# Patient Record
Sex: Female | Born: 1970 | Race: White | Hispanic: No | Marital: Married
Health system: Southern US, Community
[De-identification: ages and names within clinical notes are randomized; demographics above are authoritative.]

---

## 2004-11-20 ENCOUNTER — Encounter: Admission: RE | Admit: 2004-11-20 | Discharge: 2004-11-20 | Payer: Self-pay | Admitting: Obstetrics and Gynecology

## 2004-11-23 ENCOUNTER — Ambulatory Visit (HOSPITAL_COMMUNITY): Admission: RE | Admit: 2004-11-23 | Discharge: 2004-11-23 | Payer: Self-pay | Admitting: Surgery

## 2004-12-01 ENCOUNTER — Ambulatory Visit (HOSPITAL_COMMUNITY): Admission: RE | Admit: 2004-12-01 | Discharge: 2004-12-01 | Payer: Self-pay | Admitting: Surgery

## 2004-12-09 ENCOUNTER — Encounter: Admission: RE | Admit: 2004-12-09 | Discharge: 2005-03-09 | Payer: Self-pay | Admitting: Surgery

## 2013-12-26 ENCOUNTER — Encounter (HOSPITAL_COMMUNITY): Payer: Self-pay | Admitting: Obstetrics and Gynecology

## 2013-12-26 ENCOUNTER — Other Ambulatory Visit (HOSPITAL_COMMUNITY): Payer: Self-pay | Admitting: Obstetrics and Gynecology

## 2013-12-26 DIAGNOSIS — O09522 Supervision of elderly multigravida, second trimester: Secondary | ICD-10-CM

## 2013-12-26 DIAGNOSIS — Z3689 Encounter for other specified antenatal screening: Secondary | ICD-10-CM

## 2014-01-01 ENCOUNTER — Other Ambulatory Visit: Payer: Self-pay

## 2014-01-09 ENCOUNTER — Encounter (HOSPITAL_COMMUNITY): Payer: Self-pay

## 2014-01-09 ENCOUNTER — Other Ambulatory Visit (HOSPITAL_COMMUNITY): Payer: Self-pay

## 2014-01-29 ENCOUNTER — Ambulatory Visit (HOSPITAL_COMMUNITY)
Admission: RE | Admit: 2014-01-29 | Discharge: 2014-01-29 | Disposition: A | Discharge status: Home / Self Care | Payer: BC Managed Care – PPO | Source: Ambulatory Visit | Attending: Obstetrics and Gynecology | Admitting: Obstetrics and Gynecology

## 2014-01-29 ENCOUNTER — Other Ambulatory Visit (HOSPITAL_COMMUNITY): Payer: Self-pay | Admitting: Obstetrics and Gynecology

## 2014-01-29 DIAGNOSIS — O280 Abnormal hematological finding on antenatal screening of mother: Secondary | ICD-10-CM

## 2014-01-29 DIAGNOSIS — O3512X0 Maternal care for (suspected) chromosomal abnormality in fetus, trisomy 18, not applicable or unspecified: Secondary | ICD-10-CM | POA: Insufficient documentation

## 2014-01-29 DIAGNOSIS — O351XX0 Maternal care for (suspected) chromosomal abnormality in fetus, not applicable or unspecified: Secondary | ICD-10-CM | POA: Insufficient documentation

## 2014-01-29 DIAGNOSIS — O09529 Supervision of elderly multigravida, unspecified trimester: Secondary | ICD-10-CM | POA: Insufficient documentation

## 2014-01-29 NOTE — ED Notes (Signed)
Appointment Date:  01/29/2014 DOB: Sep 17, 1971 Referring Provider: Dahlia Bailiff, MD Attending: Derinda Sis, MD  Jessica Webb and her husband, Jessica Webb, were seen for genetic counseling because of a maternal age of 43 y.o.Marland Kitchen     They were counseled regarding maternal age and the association with risk for chromosome conditions due to nondisjunction.   We reviewed chromosomes, nondisjunction, and the associated 1 in 78 risk for fetal aneuploidy related to a maternal age of 43 y.o. in the first trimester.  We specifically discussed Down syndrome (trisomy 58), trisomies 81 and 48, and sex chromosome aneuploidies (47,XXX and 47,XXY) including the common features and prognoses of each.   We reviewed the results of her cell free DNA screening.  This was performed at 10 weeks and was consistent with Trisomy 18.  We discussed that cell free DNA testing (non invasive prenatal screening - NIPS) analyzes cell free DNA from the placenta, which is found in the maternal circulation. This test is not diagnostic for chromosome conditions, but can provide information regarding the presence or absence of extra DNA for chromosomes 13, 18 and 21. Thus, it would not identify or rule out all fetal aneuploidy. The reported detection rate is greater than 99% for Trisomy 21 and 13.  It is greater than 97% for Trisomy 18. The false positive rate is reported to be less than 0.1% for any of these conditions.   We reviewed that the cell free DNA test can not distinguish between aneuploidy confined to the placenta, trisomy 18, partial trisomy 18, or mosaic trisomy 63.  However, an amniocentesis result would be considered diagnostic. We discussed the option of amniocentesis as way to confirm the diagnosis.  It would be examining fetal cells, not placental DNA, and by evaluating intact fetal cells, it could rule out a translocation.  They were counseled about the 1 in 300-500 risk for a pregnancy complication including  miscarriage due to amniocentesis.  Given that she is only 14 weeks today, amniocentesis was not an option at this time.  We discussed that up to 90% of fetuses with Trisomy 18 will have detectable anomalies or soft markers when the fetal anatomy is well visualized on a second trimester anatomy ultrasound.  She understands that she is too early in the pregnancy to identify any anomalies associated with Trisomy 18, by ultrasound.  While a limited ultrasound was performed today, she will return at a later date for a detailed anatomy ultrasound.  Today's ultrasound report will be sent under separate cover.  She understands that not seeing any anomalies at this gestation does not lessen the suspicion for Trisomy 73.  If there are still not anomalies or soft markers later in the 2nd trimester, amniocentesis could provide clarification.  Considering the very high suspicion of trisomy 41, we discussed this diagnosis in detail.  They were counseled that trisomy 50 represents the second most common autosomal trisomy after Down syndrome and occurs in 1 in 3600-8500 livebirths. The prevalence is estimated to be much higher when pregnancy terminations, stillbirths, and miscarriages are included.  We discussed that trisomy 38 results from meiotic nondisjunction involving the 18th pair of chromosomes in the majority of cases. Partial trisomy 18 occurs from an unbalanced translocation and represents <2% of cases; and mosaic trisomy 18 occurs in approximately 4-5% of cases.   She was then counseled that trisomy 19 is characterized by prenatal onset of growth deficiency, neurodevelopmental delay that typically results in profound mental retardation in survivors, and significant organ  system anomalies. Although any organ system can be involved, we specifically discussed that cardiac defects occur in 90% of children with trisomy 18; anomalies of the brain are present in nearly all (agenesis of the corpus callosum, small cerebellum,  microgyria, and ventriculomegaly) individuals with trisomy 18; myelomeningocele occurs in 5% and approximately 2/3 of children with trisomy 18 have genitourinary anomalies, most commonly a horseshoe kidney. Additionally, omphaloceles are seen in ~30% of fetuses with trisomy 18 and limb deficiency defects (short or absent radii) occur in 5-10%. We also discussed that ultrasound may identify other more subtle differences (markers) including choroid plexus cysts, rocker bottom feet, clenched hands, and strawberry shaped cranium. Although these later findings do not cause major medical problems, they are well described findings in fetuses with trisomy 9018.  They were counseled that the above discussed findings provide a generalized overview; however, each child with trisomy 6418 is unique and an individualized healthcare plan must be established based on the needs of that child.   They were counseled that the prognosis for trisomy 18 is very guarded. She understands that pregnancies with trisomy 2218 have a significantly increased risk for miscarriage and stillbirth. Of those that survive to term, approximately 90% will die during the first year of life. Although this condition is often labeled as lethal, they were counseled that ~5-8% of children do indeed survive, some well beyond a year of life. We discussed that neonates with trisomy 5218 most often pass away from respiratory concerns secondary to congenital heart disease.   We discussed the option of continuing the pregnancy versus termination of pregnancy.  This couple stated that they are not interested in the option of termination even if the diagnosis of trisomy 1618 is confirmed.  They understand the 20 week limit for this procedure in the state of [place].  We discussed the pregnancy management recommendations and options. We discussed the option of a detailed anatomy ultrasound at 18+ weeks gestation to determine specific organ system involvement. Although  this does not clearly predict prognosis, identifying which organs are involved can help her healthcare team provide appropriate anticipatory guidance.  They were offered the option of consultation with a neonatologist and with [facility].  They understand that because there was so much to discuss and so many options available, that we can revisit them as the pregnancy progresses.   Jessica Webb and her husband were understandably upset today.  They appear to have a good support system, and asked many appropriate and thoughtful questions.  They were encouraged to contact me directly with any further questions or concerns.   Jessica Webb was provided with written information regarding cystic fibrosis (CF) including the carrier frequency and incidence in the Caucasian population, the availability of carrier testing and prenatal diagnosis if indicated.  In addition, we discussed that CF is routinely screened for as part of the [place] newborn screening panel.  She mentioned that her mother has a brother who has a son with cystic fibrosis.  She was counseled that this would give her a 1 in 4 chance to be a carrier for CF.  Her husband does not have a family history of CF and is not related to Jessica Webb's family. Thus, he has the general population chance to be a carrier for CF (1 in 25).  Therefore, without further testing, the chance for them to have a child with CF is 1 in 400.  Jessica Webb was not concerned about this chance and declined carrier testing  today.   Both family histories were reviewed.  Jessica Webb reported that she has 3 children from her first marriage who all have Andersen-Tawil syndrome.  This is an autosomal dominant disorder that causes episodes of muscle weakness, arrhythmia and developmental abnormalities.  Jessica Webb reported that genetic testing was performed and that her children's father has the condition. Since the father of this pregnancy is not the father of her other  children, we would not be concerned about Andersen-Tawil syndrome in this child.  The remainder of the family was reviewed and found to be noncontributory for birth defects, intellectual disability, and known genetic conditions. Without further information regarding the provided family history, an accurate genetic risk cannot be calculated. Further genetic counseling is warranted if more information is obtained.  Jessica Webb denied exposure to environmental toxins or chemical agents. She denied the use of alcohol, tobacco or street drugs. She denied significant viral illnesses during the course of her pregnancy. Her medical and surgical histories were noncontributory.   I counseled this couple regarding the above risks and available options.  The approximate face-to-face time with the genetic counselor was 45 minutes.  Mady Gemma, MS,  Certified Genetic Counselor

## 2014-02-04 ENCOUNTER — Other Ambulatory Visit: Payer: Self-pay

## 2014-02-04 ENCOUNTER — Ambulatory Visit (HOSPITAL_COMMUNITY)
Admission: RE | Admit: 2014-02-04 | Discharge: 2014-02-04 | Disposition: A | Discharge status: Home / Self Care | Payer: BC Managed Care – PPO | Source: Ambulatory Visit | Attending: Obstetrics and Gynecology | Admitting: Obstetrics and Gynecology

## 2014-02-04 DIAGNOSIS — O09529 Supervision of elderly multigravida, unspecified trimester: Secondary | ICD-10-CM | POA: Insufficient documentation

## 2014-02-05 ENCOUNTER — Ambulatory Visit (HOSPITAL_COMMUNITY): Payer: BC Managed Care – PPO

## 2014-02-18 ENCOUNTER — Telehealth (HOSPITAL_COMMUNITY): Payer: Self-pay | Admitting: Maternal and Fetal Medicine

## 2014-02-18 NOTE — Telephone Encounter (Signed)
Called Marda StalkerVickie Ishman to discuss her cell free fetal DNA test results.  Mrs. Marda StalkerVickie Harmening had a repeat analysis by [facility] because her first one was drawn too early.  We discussed that this second sample confirms the results from the first sample which is consistent with Trisomy 18.  We discussed that while this is just a screening test, this is most consistent with Trisomy 18.  She had questions regarding mosaic T18 vs. Full T18.  We discussed that the fetal outcomes are the same regardless, and that unless multiple tissues are sampled we can not determine if there is mosaicism.  We discussed that much of the outcome is dependent upon the presence of associated anomalies.  She understands that this testing does not identify all genetic conditions.  All questions were answered to her satisfaction, she was encouraged to call with additional questions or concerns.  Mady Gemmaaragh Conrad, MS Certified Genetic Counselor

## 2014-02-22 ENCOUNTER — Encounter (HOSPITAL_COMMUNITY): Payer: BC Managed Care – PPO

## 2014-02-22 ENCOUNTER — Ambulatory Visit (HOSPITAL_COMMUNITY): Payer: Self-pay

## 2014-02-28 ENCOUNTER — Other Ambulatory Visit (HOSPITAL_COMMUNITY): Payer: Self-pay | Admitting: Obstetrics and Gynecology

## 2014-02-28 ENCOUNTER — Encounter (HOSPITAL_COMMUNITY): Payer: Self-pay

## 2014-02-28 ENCOUNTER — Ambulatory Visit (HOSPITAL_COMMUNITY)
Admission: RE | Admit: 2014-02-28 | Discharge: 2014-02-28 | Disposition: A | Discharge status: Home / Self Care | Payer: BC Managed Care – PPO | Source: Ambulatory Visit | Attending: Obstetrics and Gynecology | Admitting: Obstetrics and Gynecology

## 2014-02-28 DIAGNOSIS — O09529 Supervision of elderly multigravida, unspecified trimester: Secondary | ICD-10-CM | POA: Insufficient documentation

## 2014-02-28 DIAGNOSIS — Z3689 Encounter for other specified antenatal screening: Secondary | ICD-10-CM

## 2014-02-28 DIAGNOSIS — Z1389 Encounter for screening for other disorder: Secondary | ICD-10-CM | POA: Insufficient documentation

## 2014-02-28 DIAGNOSIS — O09522 Supervision of elderly multigravida, second trimester: Secondary | ICD-10-CM

## 2014-02-28 DIAGNOSIS — O351XX0 Maternal care for (suspected) chromosomal abnormality in fetus, not applicable or unspecified: Secondary | ICD-10-CM | POA: Insufficient documentation

## 2014-02-28 DIAGNOSIS — O358XX0 Maternal care for other (suspected) fetal abnormality and damage, not applicable or unspecified: Secondary | ICD-10-CM | POA: Insufficient documentation

## 2014-02-28 DIAGNOSIS — Z363 Encounter for antenatal screening for malformations: Secondary | ICD-10-CM | POA: Insufficient documentation

## 2014-02-28 DIAGNOSIS — O3510X0 Maternal care for (suspected) chromosomal abnormality in fetus, unspecified, not applicable or unspecified: Secondary | ICD-10-CM | POA: Insufficient documentation

## 2014-03-12 ENCOUNTER — Other Ambulatory Visit (HOSPITAL_COMMUNITY): Payer: Self-pay | Admitting: Maternal and Fetal Medicine

## 2014-03-12 DIAGNOSIS — O351XX0 Maternal care for (suspected) chromosomal abnormality in fetus, not applicable or unspecified: Secondary | ICD-10-CM

## 2014-03-12 DIAGNOSIS — O3512X0 Maternal care for (suspected) chromosomal abnormality in fetus, trisomy 18, not applicable or unspecified: Secondary | ICD-10-CM

## 2014-03-12 DIAGNOSIS — O3680X0 Pregnancy with inconclusive fetal viability, not applicable or unspecified: Secondary | ICD-10-CM

## 2014-03-13 ENCOUNTER — Encounter (HOSPITAL_COMMUNITY): Payer: Self-pay

## 2014-03-13 ENCOUNTER — Ambulatory Visit (HOSPITAL_COMMUNITY)
Admission: RE | Admit: 2014-03-13 | Discharge: 2014-03-13 | Disposition: A | Discharge status: Home / Self Care | Payer: BC Managed Care – PPO | Source: Ambulatory Visit | Attending: Obstetrics and Gynecology | Admitting: Obstetrics and Gynecology

## 2014-03-13 DIAGNOSIS — O09529 Supervision of elderly multigravida, unspecified trimester: Secondary | ICD-10-CM | POA: Insufficient documentation

## 2014-03-13 DIAGNOSIS — O3512X0 Maternal care for (suspected) chromosomal abnormality in fetus, trisomy 18, not applicable or unspecified: Secondary | ICD-10-CM

## 2014-03-13 DIAGNOSIS — O351XX0 Maternal care for (suspected) chromosomal abnormality in fetus, not applicable or unspecified: Secondary | ICD-10-CM

## 2014-03-13 DIAGNOSIS — O3680X0 Pregnancy with inconclusive fetal viability, not applicable or unspecified: Secondary | ICD-10-CM

## 2014-03-13 DIAGNOSIS — O3510X0 Maternal care for (suspected) chromosomal abnormality in fetus, unspecified, not applicable or unspecified: Secondary | ICD-10-CM | POA: Insufficient documentation

## 2014-03-13 DIAGNOSIS — O368190 Decreased fetal movements, unspecified trimester, not applicable or unspecified: Secondary | ICD-10-CM | POA: Insufficient documentation

## 2014-03-21 ENCOUNTER — Encounter (HOSPITAL_COMMUNITY): Payer: Self-pay

## 2014-10-21 ENCOUNTER — Encounter (HOSPITAL_COMMUNITY): Payer: Self-pay

## 2014-11-13 IMAGING — US US OB DETAIL+14 WK
1 series · 12 of 28 positions shown · non-contrast
Comparison: none

[Series 1: us ob detail+14 wk · 0.19mm/px · 91 acquisitions, 12 frames shown]
[im 4/91]
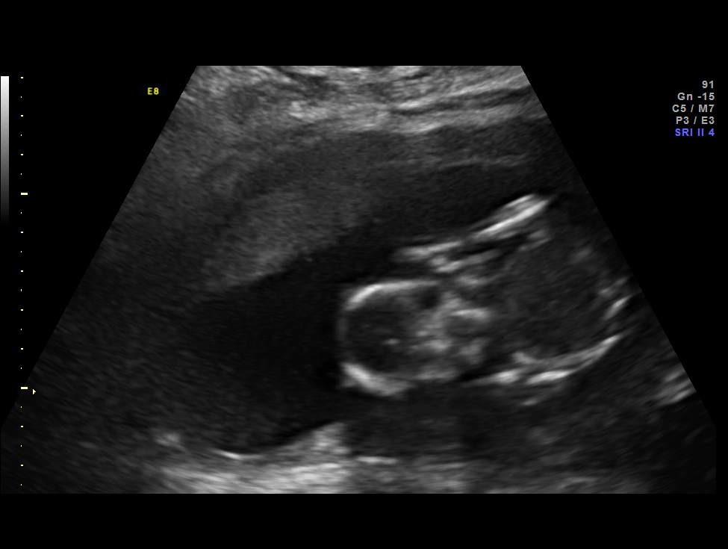
[im 11/91]
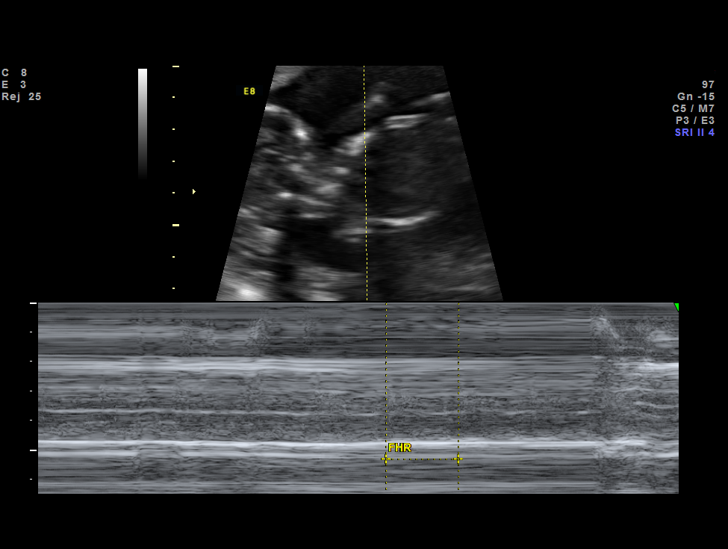
[im 17/91]
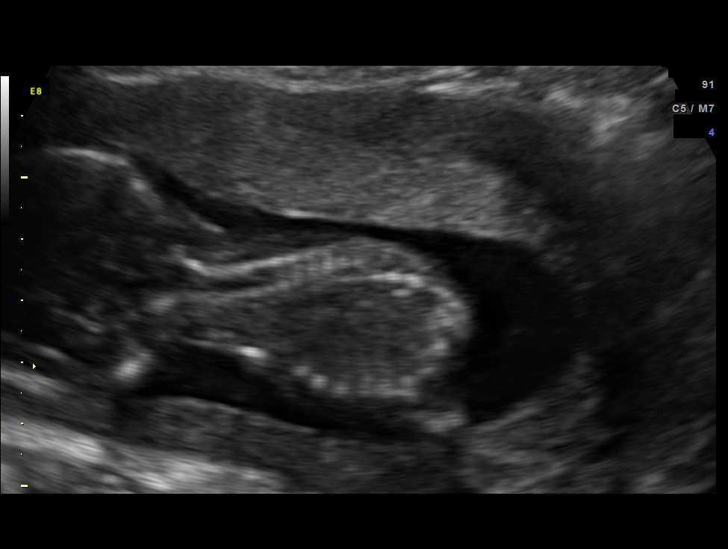
[im 27/91]
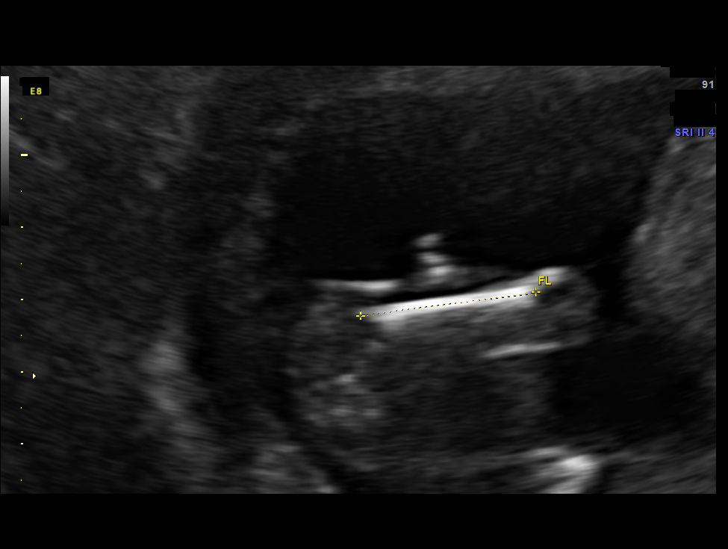
[im 34/91]
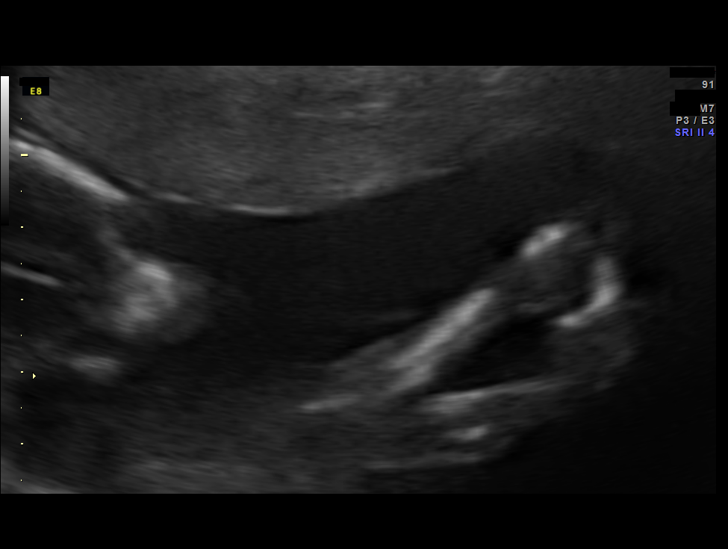
[im 41/91]
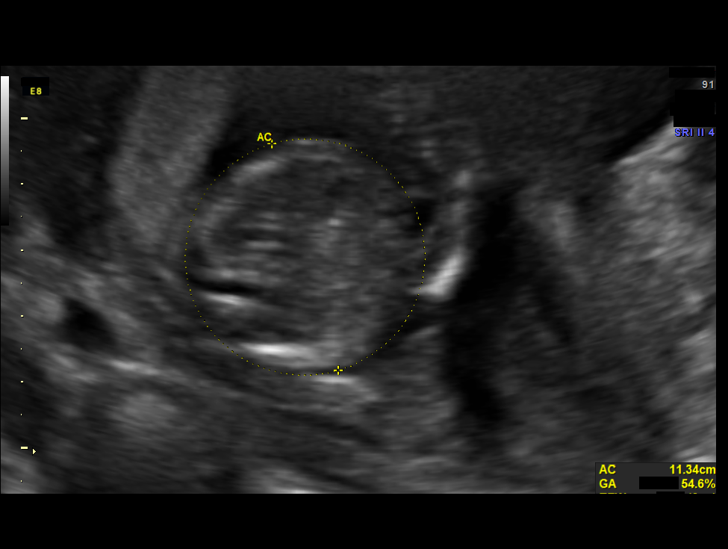
[im 51/91]
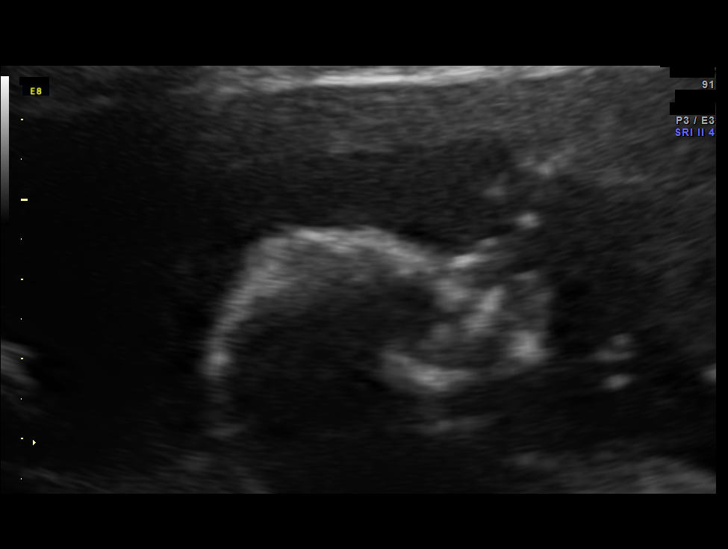
[im 57/91]
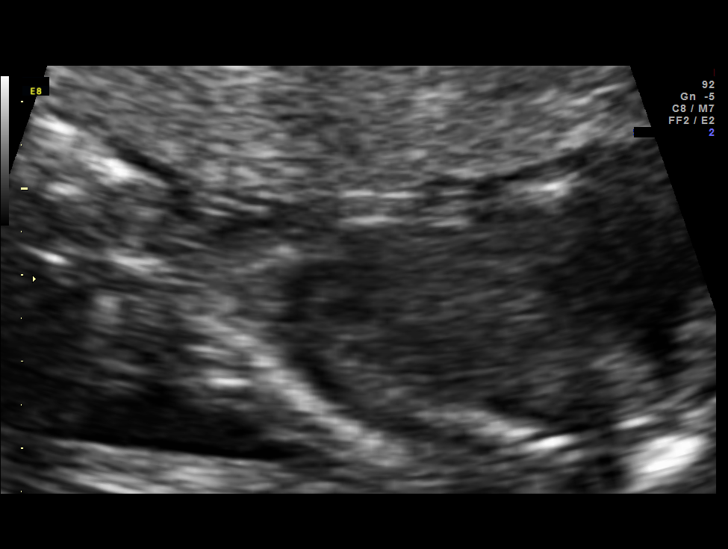
[im 64/91]
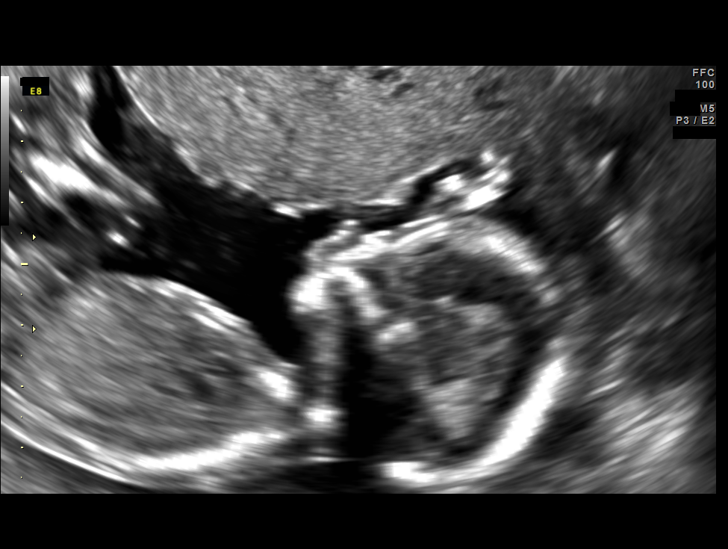
[im 74/91]
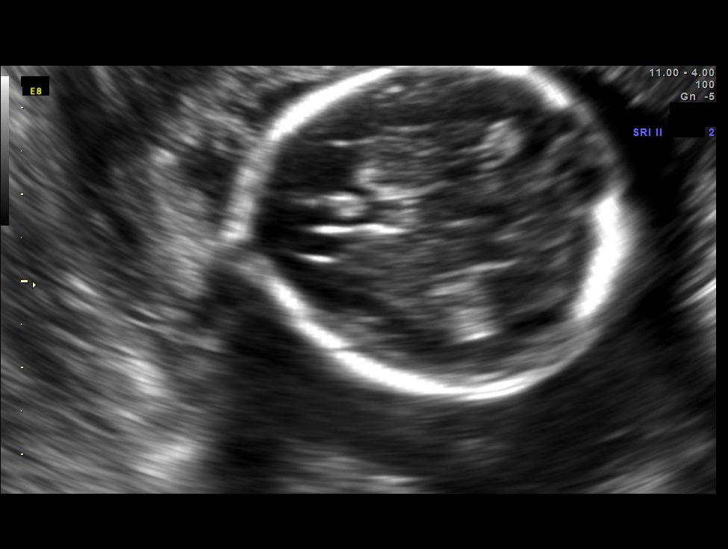
[im 81/91]
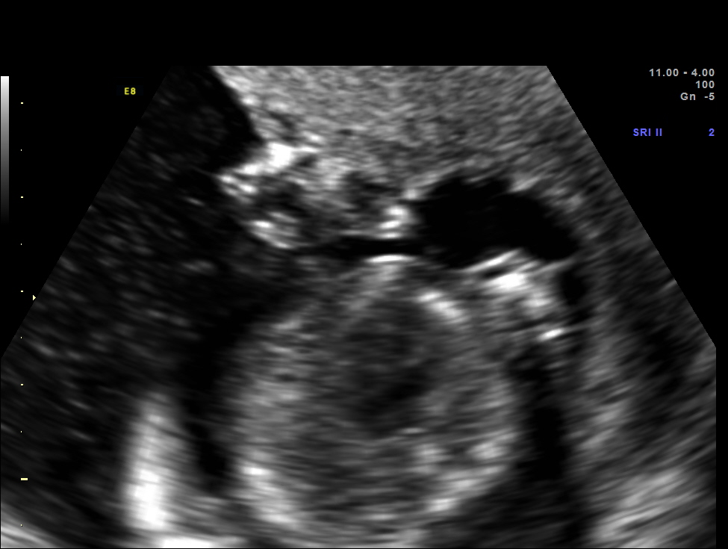
[im 87/91]
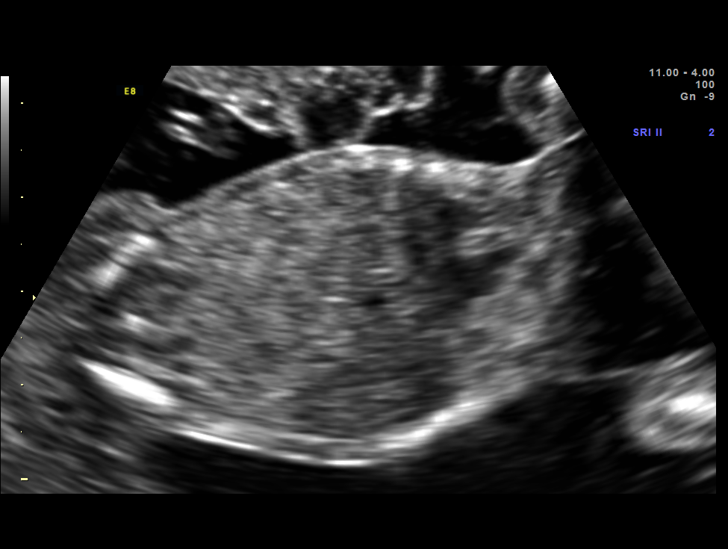

[12 of 28 positions shown; findings below may reference images not displayed]

OBSTETRICS REPORT
                      (Signed Final 02/28/2014 [DATE])

Service(s) Provided

 US OB DETAIL + 14 WK                                  76811.0
 US MFM OB TRANSVAGINAL                                76817.2
Indications

 Detailed fetal anatomic survey
 Positive Panorama
 Trisomy 18 (Than syndrome)
 Advanced maternal age (AMA), Multigravida
Fetal Evaluation

 Num Of Fetuses:    1
 Fetal Heart Rate:  139                          bpm
 Cardiac Activity:  Observed
 Presentation:      Breech
 Placenta:          Anterior, above cervical os
 P. Cord            Visualized
 Insertion:

 Amniotic Fluid
 AFI FV:      Subjectively within normal limits
                                             Larg Pckt:     3.9  cm
Biometry

 BPD:     37.3  mm     G. Age:  17w 3d                CI:         77.9   70 - 86
 OFD:     47.9  mm                                    FL/HC:      17.6   14.6 -

 HC:     135.5  mm     G. Age:  17w 0d       41  %    HC/AC:      1.22   1.07 -

 AC:     110.7  mm     G. Age:  16w 6d       47  %    FL/BPD:
 FL:      23.8  mm     G. Age:  17w 1d       51  %    FL/AC:      21.5   20 - 24
 HUM:     23.4  mm     G. Age:  17w 2d       63  %

 Est. FW:     179  gm      0 lb 6 oz     57  %
Gestational Age

 LMP:           18w 6d        Date:  10/19/13                 EDD:   07/26/14
 U/S Today:     17w 1d                                        EDD:   08/07/14
 Best:          17w 0d     Det. By:  U/S C R L (01/29/14)     EDD:   08/08/14
Anatomy
 Cranium:          Appears normal         Ductal Arch:      Not well visualized
 Fetal Cavum:      Appears normal         Diaphragm:        Appears normal
 Ventricles:       Not well visualized    Stomach:          Appears normal, left
                                                            sided
 Choroid Plexus:   Bilateral choroid      Abdomen:          Appears normal
                   plexus cysts
 Cerebellum:       Not well visualized    Abdominal Wall:   Appears nml (cord
                                                            insert, abd wall)
 Posterior Fossa:  Not well visualized    Cord Vessels:     Appears normal (3
                                                            vessel cord)
 Face:             Micrognathia           Kidneys:          Appear normal
 Lips:             Appears normal         Bladder:          Not well visualized
 Heart:            Not well visualized    Spine:            Appears normal
 RVOT:             Not well visualized    Lower             Appears normal
                                          Extremities:
 LVOT:             Not well visualized    Upper             Appears normal
                                          Extremities:
 Aortic Arch:      Not well visualized

 Other:  Fetus appears to be a female.
Cervix Uterus Adnexa

 Cervical Length:    3.9      cm

 Cervix:       Normal appearance by transabdominal scan. Appears
               closed, without funnelling.
 Left Ovary:    Not visualized. No adnexal mass visualized.
 Right Ovary:   Not visualized. No adnexal mass visualized.
Impression

 Single IUP at 17 0/7 weeks
 Trisomy 18 by NIPS (cell free fetal DNA)
 Bilateral choroid plexus cysts noted
 Abnormal hand posturing / clenched hands noted
 ? micrognathia
 Limited views of the fetal anatomy obtained due to early
 gestional age
 The fetal heart was suboptimally visualized
 Normal amniotic fluid volume

 The findings and limitations of the study were reviewed with
 the patient.
Recommendations

 Recommend follow up in 4 weeks to reevaluate the fetal
 anatomy.  Plan fetal echo at 
22 weeks.
 NICU consult in the 3rd trimester

 questions or concerns.

## 2014-11-26 IMAGING — US US OB LIMITED
1 series · 14 of 17 positions shown · non-contrast
Comparison: none

[Series 1: us ob limited · 0.22mm/px · 17 acquisitions, 14 frames shown]
[im 1/17]
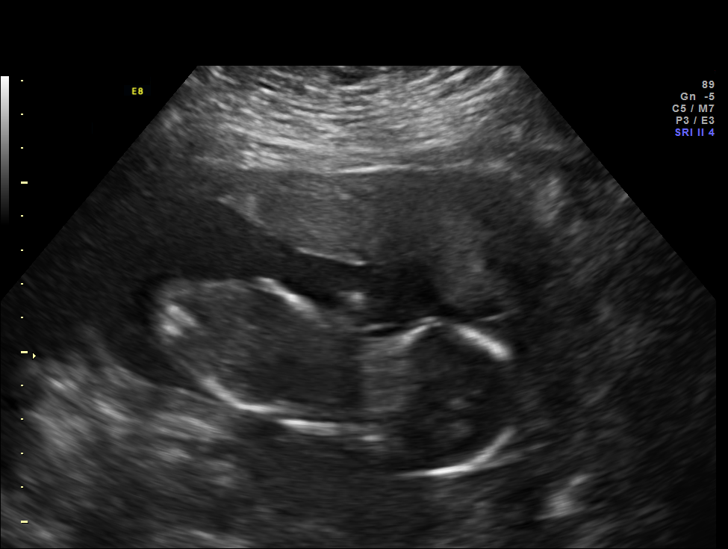
[im 2/17]
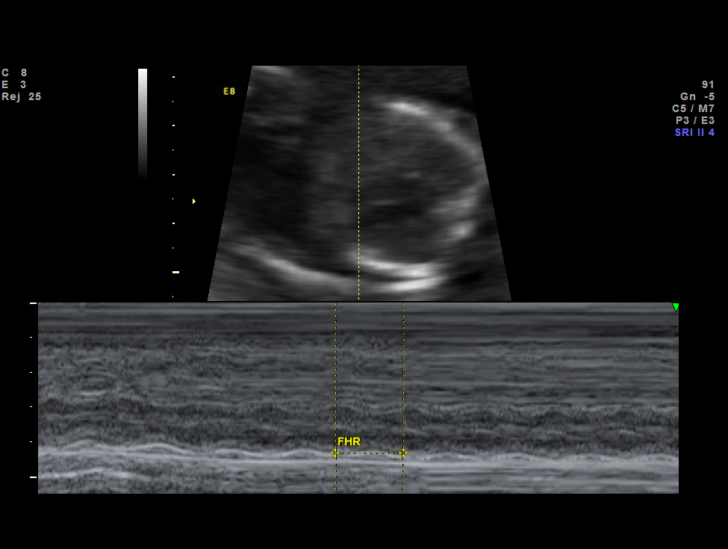
[im 4/17]
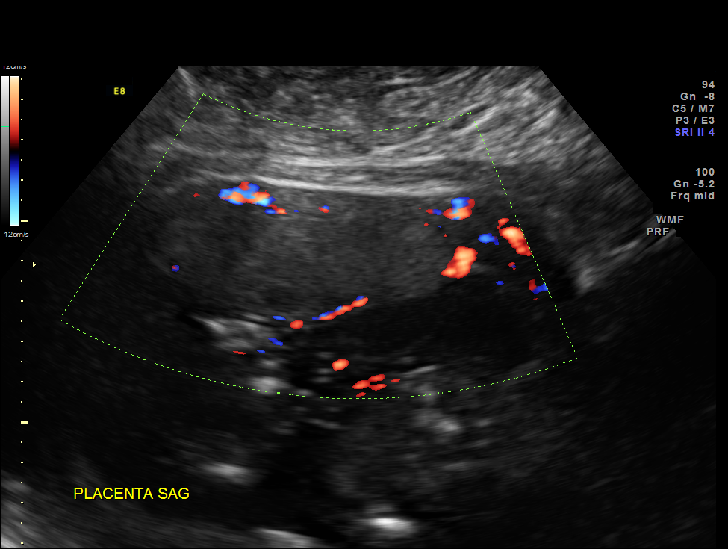
[im 5/17]
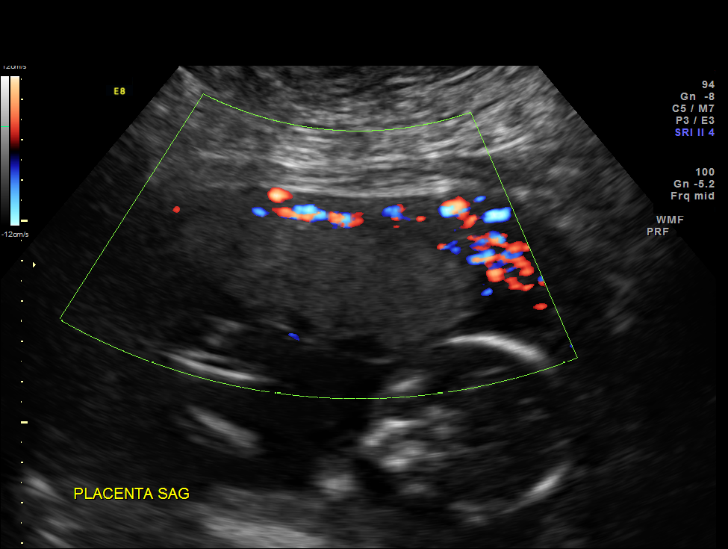
[im 6/17]
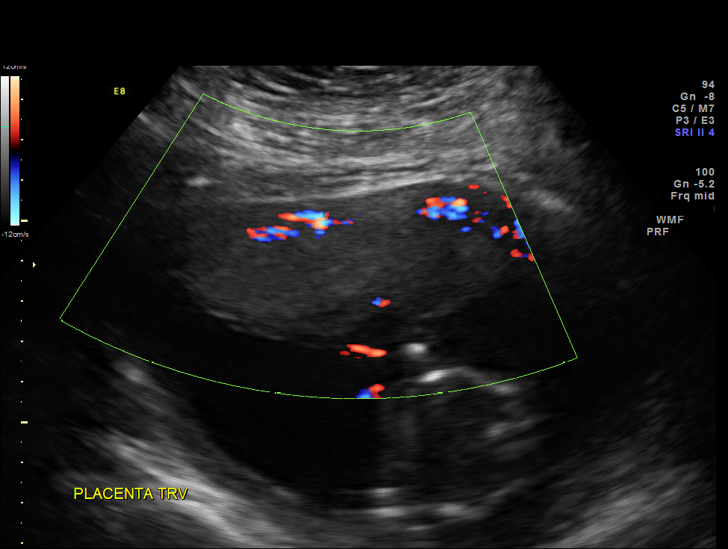
[im 7/17]
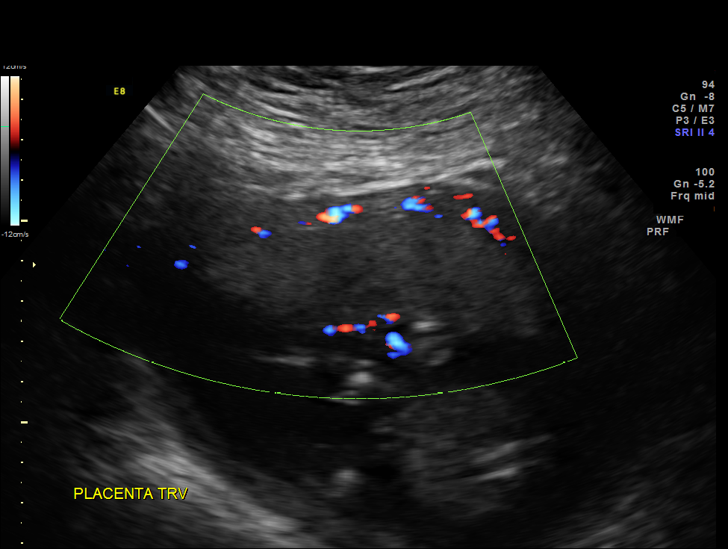
[im 8/17]
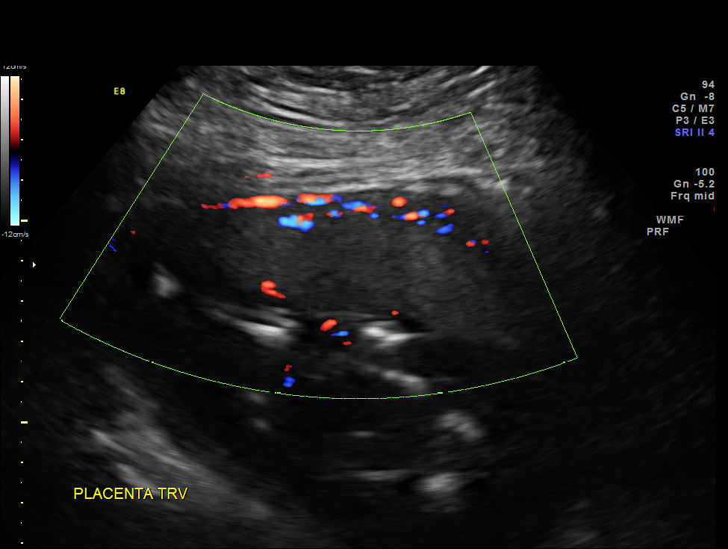
[im 10/17]
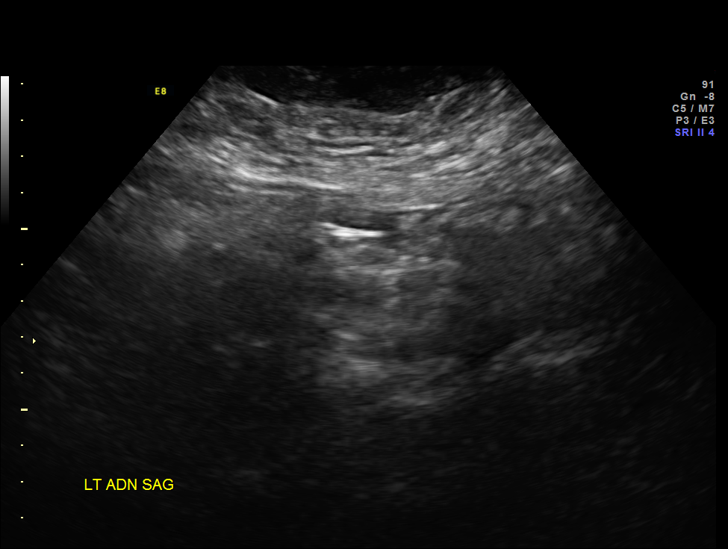
[im 11/17]
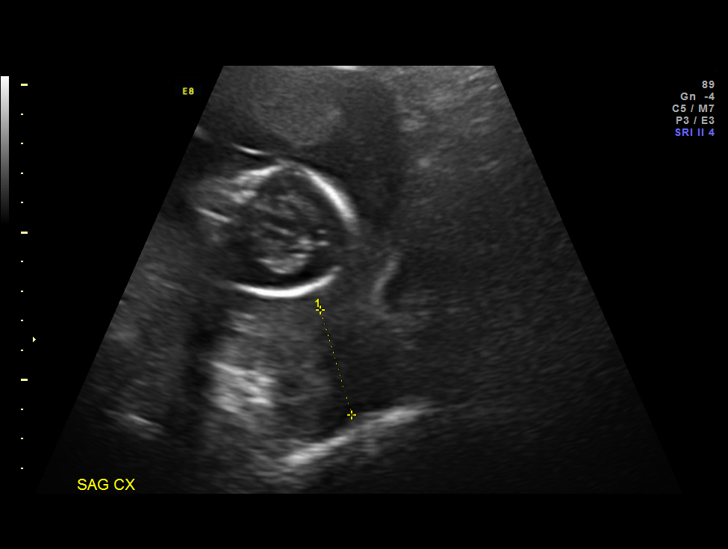
[im 12/17]
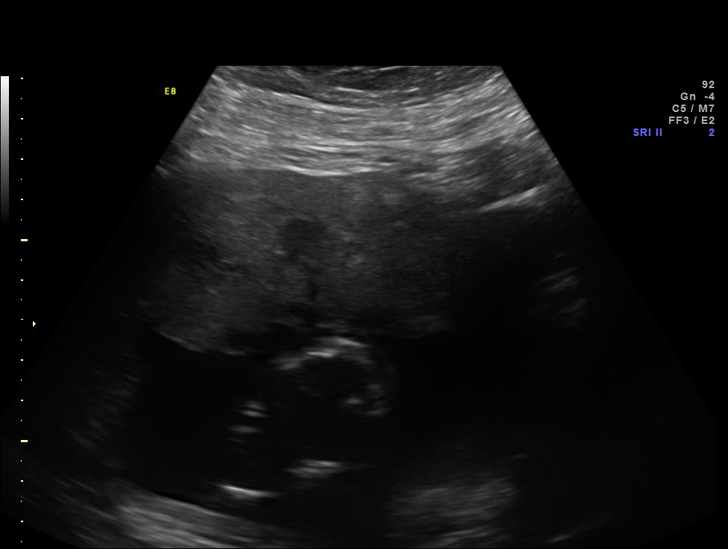
[im 13/17]
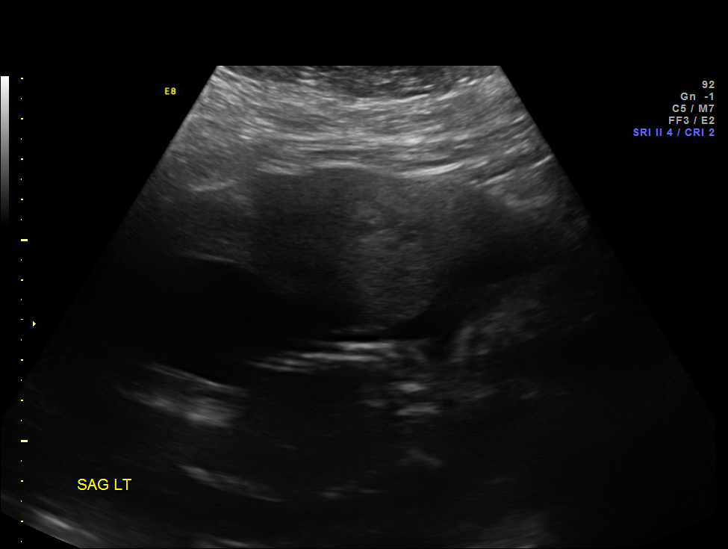
[im 14/17]
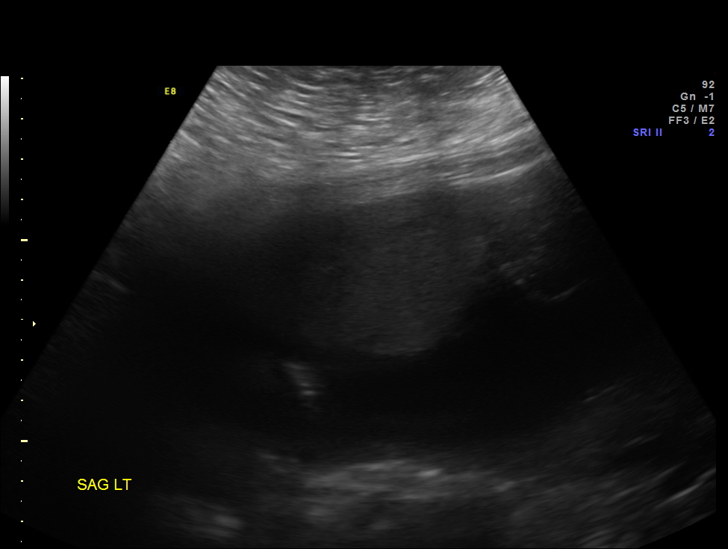
[im 16/17]
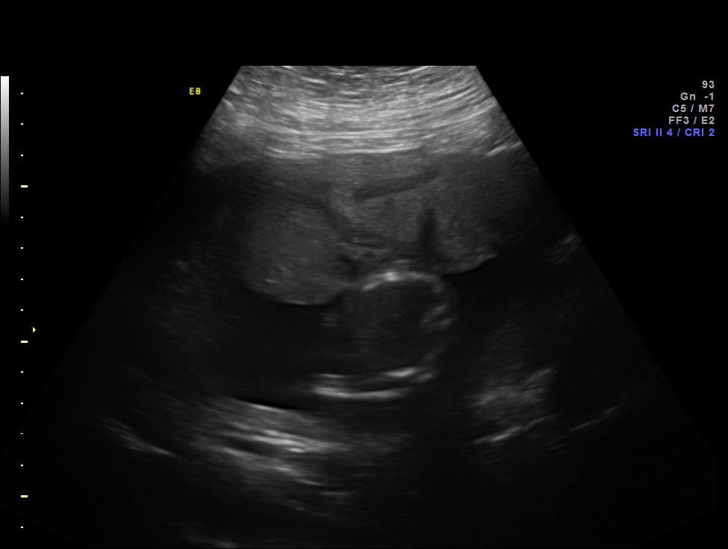
[im 17/17]
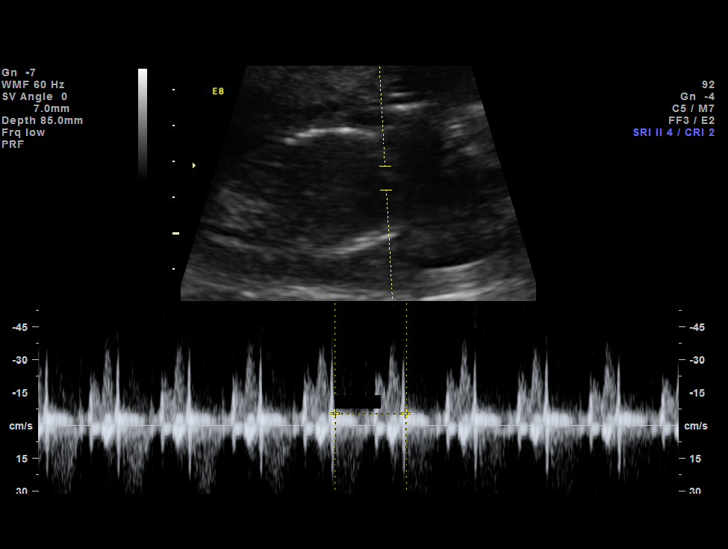

[14 of 17 positions shown; findings below may reference images not displayed]

OBSTETRICS REPORT
                      (Signed Final 03/13/2014 [DATE])

Service(s) Provided

 [HOSPITAL]                                         76815.0
Indications

 Trisomy 18 ([place] syndrome) by NIPS
 Advanced maternal age (AMA), Multigravida
 H/O Gastric bypass
 Decreased fetal movement
Fetal Evaluation

 Num Of Fetuses:    1
 Fetal Heart Rate:  148                          bpm
 Cardiac Activity:  Observed
 Presentation:      Cephalic
 Placenta:          Anterior, above cervical os
 P. Cord            Previously Visualized
 Insertion:

 Amniotic Fluid
 AFI FV:      Subjectively within normal limits
                                             Larg Pckt:     5.0  cm
Gestational Age

 LMP:           20w 5d        Date:  10/19/13                 EDD:   07/26/14
 Best:          18w 6d     Det. By:  U/S C R L (01/29/14)     EDD:   08/08/14
Cervix Uterus Adnexa

 Cervical Length:    3.7      cm

 Cervix:       Normal appearance by transabdominal scan.

 Adnexa:     No abnormality visualized.
Impression

 SIUP at 18+6 weeks
 US findings and NIPS c/w trisomy 18
 Viable fetus
Recommendations

 Keep Adalberto appointments at [facility]
 questions or concerns.

## 2015-01-03 ENCOUNTER — Encounter (HOSPITAL_COMMUNITY): Payer: Self-pay | Admitting: *Deleted

## 2021-06-08 ENCOUNTER — Other Ambulatory Visit: Payer: Self-pay

## 2021-06-08 ENCOUNTER — Ambulatory Visit (INDEPENDENT_AMBULATORY_CARE_PROVIDER_SITE_OTHER): Payer: BC Managed Care – PPO

## 2021-06-08 ENCOUNTER — Ambulatory Visit (INDEPENDENT_AMBULATORY_CARE_PROVIDER_SITE_OTHER): Payer: BC Managed Care – PPO | Admitting: Podiatry

## 2021-06-08 DIAGNOSIS — M2041 Other hammer toe(s) (acquired), right foot: Secondary | ICD-10-CM

## 2021-06-08 DIAGNOSIS — S99921A Unspecified injury of right foot, initial encounter: Secondary | ICD-10-CM

## 2021-06-08 DIAGNOSIS — S99921S Unspecified injury of right foot, sequela: Secondary | ICD-10-CM | POA: Diagnosis not present

## 2021-06-12 NOTE — Progress Notes (Signed)
Subjective:   Patient ID: Jessica Webb, female   DOB: 50 y.o.   MRN: [id number]   HPI 50 year old female presents the office with concerns of pain to her right second toe.  She states that about 15 months ago.  She spun her foot around and since then she had pain to her second toe but now she states the toe sits up causing discomfort.  Been rubbing on her shoes causing discomfort as well.  This has been worse over the last 4 months.  She has tried shoe modifications and offloading.  She had no treatment after the initial injury.  No other concerns.   Review of Systems  All other systems reviewed and are negative.  No past medical history on file.    Current Outpatient Medications:    escitalopram (LEXAPRO) 20 MG tablet, Take 2 tablets by mouth daily., Disp: , Rfl:    ferrous sulfate 325 (65 FE) MG tablet, Take by mouth., Disp: , Rfl:    fluconazole (DIFLUCAN) 150 MG tablet, , Disp: , Rfl:    levETIRAcetam (KEPPRA XR) 500 MG 24 hr tablet, Take by mouth., Disp: , Rfl:    pantoprazole (PROTONIX) 40 MG tablet, Take by mouth., Disp: , Rfl:    phentermine 15 MG capsule, Take 1 capsule by mouth every morning., Disp: , Rfl:    sucralfate (CARAFATE) 1 g tablet, Take by mouth., Disp: , Rfl:    traZODone (DESYREL) 50 MG tablet, Take by mouth., Disp: , Rfl:    triamcinolone cream (KENALOG) 0.5 %, , Disp: , Rfl:    acetaminophen (TYLENOL) 325 MG tablet, Take by mouth., Disp: , Rfl:    cefdinir (OMNICEF) 300 MG capsule, Take 300 mg by mouth 2 (two) times daily., Disp: , Rfl:    ibuprofen (ADVIL) 800 MG tablet, Take by mouth., Disp: , Rfl:    levonorgestrel (MIRENA) 20 MCG/DAY IUD, by Intrauterine route., Disp: , Rfl:    Multiple Vitamin (MULTI-VITAMIN) tablet, Take by mouth., Disp: , Rfl:   Allergies  Allergen Reactions   Meloxicam Hives   Topiramate Other (See Comments)    Memory loss Memory loss Memory loss Memory loss    Morphine And Related           Objective:  Physical Exam   General: AAO x3, NAD  Dermatological: Skin is warm, dry and supple bilateral. N There are no open sores, no preulcerative lesions, no rash or signs of infection present.  Vascular: Dorsalis Pedis artery and Posterior Tibial artery pedal pulses are 2/4 bilateral with immedate capillary fill time. There is no pain with calf compression, swelling, warmth, erythema.   Neruologic: Grossly intact via light touch bilateral.   Musculoskeletal: Upon weightbearing evaluation second toe does not purchase the ground and sits dorsiflexed and with transverse plane deformity.  Tenderness along second MPJ and tenderness submetatarsal 2.  No other areas of discomfort identified at this time.  Muscular strength 5/5 in all groups tested bilateral.  Gait: Unassisted, Nonantalgic.       Assessment:   50 year old female with likely plantar plate injury, hammertoe deformity     Plan:  -Treatment options discussed including all alternatives, risks, and complications -Etiology of symptoms were discussed -X-rays were obtained and reviewed with the patient.  Transverse plane deformity as well as sagittal plane deformity noted the second toe at the level of the MPJ consistent with likely plantar plate injury.  No evidence of acute fracture.  Elongated second metatarsal. -MRI ordered to evaluate the plantar  plate injury.  This is for surgical planning.  Discussed with her conservative and surgical options but likely can proceed with surgery as she is attempted conservative treatment and significant provement.  Vivi Barrack DPM

## 2021-06-23 ENCOUNTER — Telehealth: Payer: Self-pay | Admitting: Podiatry

## 2021-06-23 NOTE — Telephone Encounter (Signed)
Patient called our office stating she was suppose to get a MRI and was contacted stating she needed to do pre -authorization / needed to submit authorization for insurance for her to get the MRI.

## 2021-06-24 ENCOUNTER — Telehealth: Payer: Self-pay | Admitting: *Deleted

## 2021-06-24 NOTE — Telephone Encounter (Signed)
Error

## 2021-06-24 NOTE — Telephone Encounter (Signed)
-----   Message from Benn Moulder sent at 06/24/2021  2:38 PM EDT ----- [facility] [place] did you get this ----- Message ----- From: Lanney Gins, PMAC Sent: 06/17/2021   1:23 PM EDT To: Benn Moulder  Hey patient came to [facility] but I do not see a insurance card for the patient-could we call and get a copy I have to do an MRI. Thanks Misty Stanley ----- Message ----- From: Vivi Barrack, DPM Sent: 06/12/2021   9:47 AM EDT To: Lanney Gins, PMAC  I ordered a MRI of the right foot for [facility] if you can please follow up on this. Thanks!

## 2021-06-24 NOTE — Telephone Encounter (Signed)
Called and spoke with Shanda Bumps from [employer]) Case number is [id number] and phone number is [phone] and representative stated that we should hear something from either a fax or mail or phone about the prior authorization whether they need clinical notes and or deny the authorization and I also call the patient to let her know as well. Misty Stanley

## 2021-07-03 ENCOUNTER — Other Ambulatory Visit: Payer: Self-pay | Admitting: Podiatry

## 2021-07-03 DIAGNOSIS — S99921S Unspecified injury of right foot, sequela: Secondary | ICD-10-CM

## 2021-07-03 NOTE — Telephone Encounter (Signed)
We received a denial for an MRI from the patient's insurance company stating that the provider is out of network with patient's insurance plan and I called and left a message for the patient. Misty Stanley

## 2021-07-03 NOTE — Progress Notes (Signed)
MRI denied, ordered ultrasound to eval for plantar plate tear.

## 2021-07-07 ENCOUNTER — Telehealth: Payer: Self-pay | Admitting: *Deleted

## 2021-07-07 NOTE — Telephone Encounter (Signed)
[facility] (Fatima)[phone],ext. [phone] is calling and wanting to know if physician would like to include in the Ultrasound ,soft tissue a muscle, ligament and tendon([facility]),if so please fax [phone])order w/ written notes.please advise.

## 2021-07-10 ENCOUNTER — Other Ambulatory Visit: Payer: Self-pay | Admitting: Podiatry

## 2021-07-10 DIAGNOSIS — S99921S Unspecified injury of right foot, sequela: Secondary | ICD-10-CM
